# Patient Record
Sex: Male | Born: 1985 | Hispanic: Yes | Marital: Married | Smoking: Current every day smoker
Health system: Southern US, Community
[De-identification: ages and names within clinical notes are randomized; demographics above are authoritative.]

---

## 2014-09-09 ENCOUNTER — Ambulatory Visit (INDEPENDENT_AMBULATORY_CARE_PROVIDER_SITE_OTHER): Payer: Self-pay | Admitting: Physician Assistant

## 2014-09-09 VITALS — BP 122/74 | HR 67 | Temp 98.2°F | Resp 17 | Ht 70.0 in | Wt 184.0 lb

## 2014-09-09 DIAGNOSIS — L729 Follicular cyst of the skin and subcutaneous tissue, unspecified: Secondary | ICD-10-CM

## 2014-09-09 NOTE — Patient Instructions (Signed)
Keep area covered while wound is still open - change dressings at least daily. Do not get area wet for 24 hours - may get wet in the shower after 24 hours and let soap and water run over wound but do not scrub. Use ibuprofen/tylenol for pain. No work for 2 days. Return with any problems/concerns

## 2014-09-09 NOTE — Progress Notes (Signed)
   Subjective:    Patient ID: Nicholas Bowen, male    DOB: Jan 06, 1986, 29 y.o.   MRN: [id number]  HPI  This is a 29 year old male with no significant PMH presenting with "cyst" on his left buttock that keeps getting bigger and bothers him when he is sitting. He has never had anything like this before. He has not had any drainage from the lesion. He denies fever or chills.  Review of Systems  Constitutional: Negative for fever and chills.  Gastrointestinal: Negative for nausea, vomiting and diarrhea.  Skin:       Bump on skin    There are no active problems to display for this patient.  Prior to Admission medications   Not on File   No Known Allergies  Patient's social and family history were reviewed.     Objective:   Physical Exam  Constitutional: He is oriented to person, place, and time. He appears well-developed and well-nourished. No distress.  HENT:  Head: Normocephalic and atraumatic.  Right Ear: Hearing normal.  Left Ear: Hearing normal.  Nose: Nose normal.  Eyes: Conjunctivae and lids are normal. Right eye exhibits no discharge. Left eye exhibits no discharge. No scleral icterus.  Pulmonary/Chest: Effort normal. No respiratory distress.  Musculoskeletal: Normal range of motion.  Neurological: He is alert and oriented to person, place, and time.  Skin: Skin is warm, dry and intact.  1.5x1 cm papule located on lower left buttock that is protruding from skin 1 cm. Lesion is pink without central pore. Lesion is soft.   Psychiatric: He has a normal mood and affect. His speech is normal and behavior is normal. Thought content normal.   BP 122/74 mmHg  Pulse 67  Temp(Src) 98.2 F (36.8 C) (Oral)  Resp 17  Ht 5\' 10"  (1.778 m)  Wt 184 lb (83.462 kg)  BMI 26.40 kg/m2  SpO2 99%  Procedure: verbal consent obtained. Area cleaned with alcohol and anesthetized with 2 cc 1% lido with epi. Area cleaned with 3 betadine swabs. Lesion removed with a dermablade revealing a cyst.  drysol applied to stop bleeding. Wound dressed and wound care discussed.     Assessment & Plan:  1. Cyst of buttocks Cyst was removed completely. Wound dressed and wound care discussed. He will return with any problems/concerns.   Roswell MinersNicole V. Dyke BrackettBush, PA-C, MHS [facility]  09/09/2014

## 2021-03-02 ENCOUNTER — Encounter (HOSPITAL_COMMUNITY): Payer: Self-pay | Admitting: Emergency Medicine

## 2021-03-02 ENCOUNTER — Emergency Department (HOSPITAL_COMMUNITY)
Admission: EM | Admit: 2021-03-02 | Discharge: 2021-03-02 | Disposition: A | Discharge status: Home / Self Care | Payer: Self-pay | Attending: Emergency Medicine | Admitting: Emergency Medicine

## 2021-03-02 DIAGNOSIS — R590 Localized enlarged lymph nodes: Secondary | ICD-10-CM

## 2021-03-02 DIAGNOSIS — R21 Rash and other nonspecific skin eruption: Secondary | ICD-10-CM | POA: Insufficient documentation

## 2021-03-02 DIAGNOSIS — R7989 Other specified abnormal findings of blood chemistry: Secondary | ICD-10-CM

## 2021-03-02 DIAGNOSIS — R599 Enlarged lymph nodes, unspecified: Secondary | ICD-10-CM | POA: Insufficient documentation

## 2021-03-02 DIAGNOSIS — F17210 Nicotine dependence, cigarettes, uncomplicated: Secondary | ICD-10-CM | POA: Insufficient documentation

## 2021-03-02 LAB — CBC WITH DIFFERENTIAL/PLATELET
Abs Immature Granulocytes: 0.05 10*3/uL (ref 0.00–0.07)
Basophils Absolute: 0 10*3/uL (ref 0.0–0.1)
Basophils Relative: 1 %
Eosinophils Absolute: 1.3 10*3/uL — ABNORMAL HIGH (ref 0.0–0.5)
Eosinophils Relative: 17 %
HCT: 47.7 % (ref 39.0–52.0)
Hemoglobin: 16.2 g/dL (ref 13.0–17.0)
Immature Granulocytes: 1 %
Lymphocytes Relative: 10 %
Lymphs Abs: 0.7 10*3/uL (ref 0.7–4.0)
MCH: 31.3 pg (ref 26.0–34.0)
MCHC: 34 g/dL (ref 30.0–36.0)
MCV: 92.3 fL (ref 80.0–100.0)
Monocytes Absolute: 0.6 10*3/uL (ref 0.1–1.0)
Monocytes Relative: 8 %
Neutro Abs: 4.8 10*3/uL (ref 1.7–7.7)
Neutrophils Relative %: 63 %
Platelets: 252 10*3/uL (ref 150–400)
RBC: 5.17 MIL/uL (ref 4.22–5.81)
RDW: 12.9 % (ref 11.5–15.5)
WBC: 7.5 10*3/uL (ref 4.0–10.5)
nRBC: 0 % (ref 0.0–0.2)

## 2021-03-02 LAB — COMPREHENSIVE METABOLIC PANEL
ALT: 458 U/L — ABNORMAL HIGH (ref 0–44)
AST: 185 U/L — ABNORMAL HIGH (ref 15–41)
Albumin: 4.3 g/dL (ref 3.5–5.0)
Alkaline Phosphatase: 191 U/L — ABNORMAL HIGH (ref 38–126)
Anion gap: 10 (ref 5–15)
BUN: 20 mg/dL (ref 6–20)
CO2: 24 mmol/L (ref 22–32)
Calcium: 9.4 mg/dL (ref 8.9–10.3)
Chloride: 104 mmol/L (ref 98–111)
Creatinine, Ser: 0.81 mg/dL (ref 0.61–1.24)
GFR, Estimated: >60 mL/min (ref >60)
Glucose, Bld: 104 mg/dL — ABNORMAL HIGH (ref 70–99)
Potassium: 3.5 mmol/L (ref 3.5–5.1)
Sodium: 138 mmol/L (ref 135–145)
Total Bilirubin: 1 mg/dL (ref 0.3–1.2)
Total Protein: 8 g/dL (ref 6.5–8.1)

## 2021-03-02 MED ORDER — HYDROXYZINE HCL 25 MG PO TABS: 25.0000 mg | ORAL_TABLET | Freq: Four times a day (QID) | ORAL | 0 refills | Status: AC

## 2021-03-02 MED ORDER — CEPHALEXIN 500 MG PO CAPS: 500.0000 mg | ORAL_CAPSULE | Freq: Four times a day (QID) | ORAL | 0 refills | Status: DC

## 2021-03-02 NOTE — ED Provider Notes (Signed)
[facility]-EMERGENCY DEPT Provider Note   CSN: [id number] Arrival date & time: 03/02/21  1328     History Chief Complaint  Patient presents with   Rash   Neck Pain    Nicholas Bowen is a 35 y.o. male.  Patient is a 35 year old male presenting with complaints of rash and swollen lump under his left mandible.  This has been present for the past 5 days.  Patient states that he has felt weak and unwell during this period of time.  He denies any fevers or chills.  He denies sore throat.  He describes an itchy rash to his hands, arms, and legs.  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  Patient speaks minimal [place], mainly [place].  History taken with the use of the translator tablet.  The history is provided by the patient.  Rash Severity:  Moderate Onset quality:  Gradual Duration:  1 week Timing:  Constant Progression:  Worsening Chronicity:  New Relieved by:  Nothing Worsened by:  Nothing Ineffective treatments:  None tried     History reviewed. No pertinent past medical history.  There are no problems to display for this patient.   History reviewed. No pertinent surgical history.     No family history on file.  Social History   Tobacco Use   Smoking status: Every Day    Packs/day: 0.50    Years: 12.00    Pack years: 6.00    Types: Cigarettes    Home Medications Prior to Admission medications   Not on File    Allergies    Patient has no known allergies.  Review of Systems   Review of Systems  Skin:  Positive for rash.  All other systems reviewed and are negative.  Physical Exam Updated Vital Signs BP (!) 152/99   Pulse 70   Temp 98.3 F (36.8 C) (Oral)   Resp 18   SpO2 98%   Physical Exam Vitals and nursing note reviewed.  Constitutional:      General: He is not in acute distress.    Appearance: He is well-developed. He is not diaphoretic.  HENT:     Head: Normocephalic and atraumatic.   Neck:     Comments: There is a 2 cm, tender, swollen node in the left submandibular region.  Posterior oropharynx is clear without exudate or erythema. Cardiovascular:     Rate and Rhythm: Normal rate and regular rhythm.     Heart sounds: No murmur heard.   No friction rub.  Pulmonary:     Effort: Pulmonary effort is normal. No respiratory distress.     Breath sounds: Normal breath sounds. No wheezing or rales.  Abdominal:     General: Bowel sounds are normal. There is no distension.     Palpations: Abdomen is soft.     Tenderness: There is no abdominal tenderness.  Musculoskeletal:        General: Normal range of motion.     Cervical back: Normal range of motion and neck supple. Tenderness present. No rigidity.  Lymphadenopathy:     Cervical: Cervical adenopathy present.  Skin:    General: Skin is warm and dry.     Comments: There is a fine, sandpaper-like rash to the hands, arms, and legs.  There are no petechial lesions.  Neurological:     Mental Status: He is alert and oriented to person, place, and time.     Coordination: Coordination normal.  ED Results / Procedures / Treatments   Labs (all labs ordered are listed, but only abnormal results are displayed) Labs Reviewed  COMPREHENSIVE METABOLIC PANEL - Abnormal; Notable for the following components:      Result Value   Glucose, Bld 104 (*)    AST 185 (*)    ALT 458 (*)    Alkaline Phosphatase 191 (*)    All other components within normal limits  CBC WITH DIFFERENTIAL/PLATELET - Abnormal; Notable for the following components:   Eosinophils Absolute 1.3 (*)    All other components within normal limits  MUMPS VIRAL CULTURE  MUMPS ANTIBODY, IGM  MUMPS ANTIBODY, IGG    EKG None  Radiology No results found.  Procedures Procedures   Medications Ordered in ED Medications - No data to display  ED Course  I have reviewed the triage vital signs and the nursing notes.  Pertinent labs & imaging results that  were available during my care of the patient were reviewed by me and considered in my medical decision making (see chart for details).    MDM Rules/Calculators/A&P  Patient presenting with swollen submandibular lymph node, likely reactive in nature.  It is tender to palpation and will be treated with Keflex.  He will also be given hydroxyzine he can take for the rash.  Laboratory studies show an elevation of his LFTs, the significance of which I am uncertain.  An acute hepatitis panel will be added onto his laboratory studies.  Patient does admit to consuming alcohol, but mainly on the weekends.  He denies excessive Tylenol use, but does tell me he takes a pain medication he originally purchased in [place], but does not know the name.  I have advised him to refrain from alcohol consumption, avoid Tylenol use, and will require follow-up of his LFTs as an outpatient.  Patient has no primary doctor, and was advised to see about establishing primary.  Final Clinical Impression(s) / ED Diagnoses Final diagnoses:  None    Rx / DC Orders ED Discharge Orders     None        Geoffery Lyons, MD 03/02/21 2308

## 2021-03-02 NOTE — ED Triage Notes (Signed)
Triage completed using interpreter Marily Memos [phone].  Patient c/o overwhelming restlessness with itching rash to legs, chest, and hands. Also reports L neck pain with swelling.

## 2021-03-02 NOTE — Discharge Instructions (Addendum)
Begin taking Keflex as prescribed.  Begin taking hydroxyzine as prescribed as needed for itching.  You should have your liver functions retested in 7 to 10 days.  Please follow-up with a primary doctor for this and to have your lymph node rechecked.

## 2021-03-02 NOTE — ED Provider Notes (Signed)
Emergency Medicine Provider Triage Evaluation Note  Nicholas Bowen , a 35 y.o. male  was evaluated in triage.  Pt complains of facial swelling as well as rash.  Patient states that about 3 days ago he began having some left-sided swelling to the left submandibular region.  It became painful over the past 1 to 2 days.  Also over the past 1 to 2 days he began experiencing a pruritic papular rash to the hands, arms, legs, and torso.  Reports associated fatigue as well as a feeling of anxiety today.  No shortness of breath or chest pain.  Physical Exam  BP (!) 145/108   Pulse 83   Temp 98.3 F (36.8 C) (Oral)   Resp 18   SpO2 97%  Gen:   Awake, no distress   Resp:  Normal effort  MSK:   Moves extremities without difficulty  Other:    Medical Decision Making  Medically screening exam initiated at 2:14 PM.  Appropriate orders placed.  Nicholas Bowen was informed that the remainder of the evaluation will be completed by another provider, this initial triage assessment does not replace that evaluation, and the importance of remaining in the ED until their evaluation is complete.   Placido Sou, PA-C 03/02/21 1415    Little, Ambrose Finland, MD 03/02/21 [phone]

## 2021-03-03 LAB — HEPATITIS PANEL, ACUTE
HCV Ab: NONREACTIVE
Hep A IgM: NONREACTIVE
Hep B C IgM: NONREACTIVE
Hepatitis B Surface Ag: NONREACTIVE

## 2021-03-04 LAB — MUMPS ANTIBODY, IGG: Mumps IgG: 87.8 AU/mL (ref >10.9)

## 2021-03-04 LAB — MUMPS ANTIBODY, IGM: Mumps IgM: <0.8 AU (ref 0.00–0.79)

## 2021-03-06 ENCOUNTER — Emergency Department (HOSPITAL_COMMUNITY): Payer: Self-pay

## 2021-03-06 ENCOUNTER — Emergency Department (HOSPITAL_COMMUNITY)
Admission: EM | Admit: 2021-03-06 | Discharge: 2021-03-07 | Disposition: A | Discharge status: Home / Self Care | Payer: Self-pay | Attending: Emergency Medicine | Admitting: Emergency Medicine

## 2021-03-06 ENCOUNTER — Other Ambulatory Visit: Payer: Self-pay

## 2021-03-06 ENCOUNTER — Encounter (HOSPITAL_COMMUNITY): Payer: Self-pay

## 2021-03-06 DIAGNOSIS — R059 Cough, unspecified: Secondary | ICD-10-CM | POA: Insufficient documentation

## 2021-03-06 DIAGNOSIS — R7989 Other specified abnormal findings of blood chemistry: Secondary | ICD-10-CM

## 2021-03-06 DIAGNOSIS — R7401 Elevation of levels of liver transaminase levels: Secondary | ICD-10-CM | POA: Insufficient documentation

## 2021-03-06 DIAGNOSIS — R0602 Shortness of breath: Secondary | ICD-10-CM | POA: Insufficient documentation

## 2021-03-06 DIAGNOSIS — Z20822 Contact with and (suspected) exposure to covid-19: Secondary | ICD-10-CM | POA: Insufficient documentation

## 2021-03-06 DIAGNOSIS — F17210 Nicotine dependence, cigarettes, uncomplicated: Secondary | ICD-10-CM | POA: Insufficient documentation

## 2021-03-06 DIAGNOSIS — I889 Nonspecific lymphadenitis, unspecified: Secondary | ICD-10-CM | POA: Insufficient documentation

## 2021-03-06 DIAGNOSIS — R079 Chest pain, unspecified: Secondary | ICD-10-CM | POA: Insufficient documentation

## 2021-03-06 LAB — CBC
HCT: 48.9 % (ref 39.0–52.0)
Hemoglobin: 16.3 g/dL (ref 13.0–17.0)
MCH: 31.2 pg (ref 26.0–34.0)
MCHC: 33.3 g/dL (ref 30.0–36.0)
MCV: 93.5 fL (ref 80.0–100.0)
Platelets: 286 10*3/uL (ref 150–400)
RBC: 5.23 MIL/uL (ref 4.22–5.81)
RDW: 13.1 % (ref 11.5–15.5)
WBC: 12.4 10*3/uL — ABNORMAL HIGH (ref 4.0–10.5)
nRBC: 0 % (ref 0.0–0.2)

## 2021-03-06 LAB — HEPATIC FUNCTION PANEL
ALT: 234 U/L — ABNORMAL HIGH (ref 0–44)
AST: 96 U/L — ABNORMAL HIGH (ref 15–41)
Albumin: 4.1 g/dL (ref 3.5–5.0)
Alkaline Phosphatase: 276 U/L — ABNORMAL HIGH (ref 38–126)
Bilirubin, Direct: 0.3 mg/dL — ABNORMAL HIGH (ref 0.0–0.2)
Indirect Bilirubin: 0.7 mg/dL (ref 0.3–0.9)
Total Bilirubin: 1 mg/dL (ref 0.3–1.2)
Total Protein: 8.2 g/dL — ABNORMAL HIGH (ref 6.5–8.1)

## 2021-03-06 LAB — BASIC METABOLIC PANEL
Anion gap: 10 (ref 5–15)
BUN: 15 mg/dL (ref 6–20)
CO2: 24 mmol/L (ref 22–32)
Calcium: 9.6 mg/dL (ref 8.9–10.3)
Chloride: 101 mmol/L (ref 98–111)
Creatinine, Ser: 0.86 mg/dL (ref 0.61–1.24)
GFR, Estimated: >60 mL/min (ref >60)
Glucose, Bld: 118 mg/dL — ABNORMAL HIGH (ref 70–99)
Potassium: 3.8 mmol/L (ref 3.5–5.1)
Sodium: 135 mmol/L (ref 135–145)

## 2021-03-06 LAB — TROPONIN I (HIGH SENSITIVITY)
Troponin I (High Sensitivity): 2 ng/L (ref <18)
Troponin I (High Sensitivity): 2 ng/L (ref <18)

## 2021-03-06 LAB — RESP PANEL BY RT-PCR (FLU A&B, COVID) ARPGX2
Influenza A by PCR: NEGATIVE
Influenza B by PCR: NEGATIVE
SARS Coronavirus 2 by RT PCR: NEGATIVE

## 2021-03-06 MED ORDER — IOHEXOL 350 MG/ML SOLN
75.0000 mL | Freq: Once | INTRAVENOUS | Status: AC | PRN
  Administered 2021-03-06: 75 mL via INTRAVENOUS

## 2021-03-06 MED ORDER — KETOROLAC TROMETHAMINE 15 MG/ML IJ SOLN
15.0000 mg | Freq: Once | INTRAMUSCULAR | Status: AC
  Administered 2021-03-06: 15 mg via INTRAVENOUS
  Filled 2021-03-06: qty 1

## 2021-03-06 MED ORDER — AMOXICILLIN-POT CLAVULANATE 875-125 MG PO TABS: 1.0000 | ORAL_TABLET | Freq: Two times a day (BID) | ORAL | 0 refills | Status: DC

## 2021-03-06 NOTE — ED Provider Notes (Signed)
Emergency Medicine Provider Triage Evaluation Note  Nicholas Bowen , a 35 y.o. male  was evaluated in triage.  Pt complains of chest pain that began this AM. Pain is to the left chest to back, difficult to describe. Constant. No recent injury or change in activity.   Review of Systems  Positive: Chest pain Negative: N/V, syncope, cough, hemoptysius  Physical Exam  BP (!) 127/93 (BP Location: Right Arm)   Pulse 95   Temp 98.7 F (37.1 C) (Oral)   Resp 13   SpO2 98%  Gen:   Awake Resp:  Normal effort  MSK:   Moves extremities without difficulty  Other:  Left anterior & posterior chest wall tenderness.   Medical Decision Making  Medically screening exam initiated at 10:48 AM.  Appropriate orders placed.  Nicholas Bowen was informed that the remainder of the evaluation will be completed by another provider, this initial triage assessment does not replace that evaluation, and the importance of remaining in the ED until their evaluation is complete.  Chest pain   Desmond Lope 03/06/21 1050    Wynetta Fines, MD 03/06/21 1406

## 2021-03-06 NOTE — ED Triage Notes (Addendum)
Pt c/o L chest pain radiating into L shoulder x starting this morning.  Pain score 9/10.  Pt reports he was seen x 4 days ago for a L side dental abscess and rash.  Pt reports those symptoms have not gotten better.  Sts he was sent for other "liver studies" but he has not gotten the results.     Triage was completed using interpreter Byrd Hesselbach).

## 2021-03-06 NOTE — ED Provider Notes (Signed)
[facility]-EMERGENCY DEPT Provider Note   CSN: [id number] Arrival date & time: 03/06/21  1008     History Chief Complaint  Patient presents with   Chest Pain    Nicholas Bowen is a 35 y.o. male.   Chest Pain Pain location:  L chest Pain quality: aching and dull   Pain radiates to:  L shoulder Pain severity:  Moderate Timing:  Intermittent Chronicity:  New Context: breathing and at rest   Relieved by:  None tried Worsened by:  Coughing and deep breathing Associated symptoms: cough and shortness of breath   Associated symptoms: no abdominal pain, no altered mental status, no diaphoresis, no dysphagia, no fever, no headache, no heartburn, no lower extremity edema, no nausea, no near-syncope, no palpitations and no vomiting   Risk factors: male sex and smoking    HPI: A 35 year old patient presents for evaluation of chest pain. Initial onset of pain was more than 6 hours ago. The patient's chest pain is sharp and is not worse with exertion. The patient's chest pain is middle- or left-sided, is not well-localized, is not described as heaviness/pressure/tightness and does radiate to the arms/jaw/neck. The patient does not complain of nausea and denies diaphoresis. The patient has smoked in the past 90 days. The patient has no history of stroke, has no history of peripheral artery disease, denies any history of treated diabetes, has no relevant family history of coronary artery disease (first degree relative at less than age 37), is not hypertensive, has no history of hypercholesterolemia and does not have an elevated BMI (>=30).   History reviewed. No pertinent past medical history.  There are no problems to display for this patient.   History reviewed. No pertinent surgical history.     No family history on file.  Social History   Tobacco Use   Smoking status: Every Day    Packs/day: 0.50    Years: 12.00    Pack years: 6.00    Types:  Cigarettes  Substance Use Topics   Alcohol use: Yes   Drug use: Not Currently    Home Medications Prior to Admission medications   Medication Sig Start Date End Date Taking? Authorizing Provider  amoxicillin-clavulanate (AUGMENTIN) 875-125 MG tablet Take 1 tablet by mouth every 12 (twelve) hours. 03/07/21   Ernie AvenaLawsing, Skyleigh Windle, MD  hydrOXYzine (ATARAX/VISTARIL) 25 MG tablet Take 1 tablet (25 mg total) by mouth every 6 (six) hours. 03/02/21   Geoffery Lyonselo, Douglas, MD    Allergies    Patient has no known allergies.  Review of Systems   Review of Systems  Constitutional:  Negative for diaphoresis and fever.  HENT:  Positive for facial swelling. Negative for trouble swallowing.   Respiratory:  Positive for cough and shortness of breath.   Cardiovascular:  Positive for chest pain. Negative for palpitations and near-syncope.  Gastrointestinal:  Negative for abdominal pain, heartburn, nausea and vomiting.  Neurological:  Negative for headaches.  All other systems reviewed and are negative.  Physical Exam Updated Vital Signs BP 110/71   Pulse 79   Temp 98.7 F (37.1 C) (Oral)   Resp 19   SpO2 95%   Physical Exam Vitals and nursing note reviewed.  Constitutional:      Appearance: He is well-developed.  HENT:     Head: Normocephalic and atraumatic.     Jaw: No trismus.     Comments: Left-sided tender submandibular lymph node present with no surrounding erythema.  No oropharyngeal swelling. Eyes:  Conjunctiva/sclera: Conjunctivae normal.  Cardiovascular:     Rate and Rhythm: Normal rate and regular rhythm.     Heart sounds: No murmur heard. Pulmonary:     Effort: Pulmonary effort is normal. No respiratory distress.     Breath sounds: Normal breath sounds.  Abdominal:     Palpations: Abdomen is soft.     Tenderness: There is no abdominal tenderness.  Musculoskeletal:     Cervical back: Neck supple.  Lymphadenopathy:     Cervical: Cervical adenopathy present.  Skin:    General:  Skin is warm and dry.  Neurological:     Mental Status: He is alert.    ED Results / Procedures / Treatments   Labs (all labs ordered are listed, but only abnormal results are displayed) Labs Reviewed  BASIC METABOLIC PANEL - Abnormal; Notable for the following components:      Result Value   Glucose, Bld 118 (*)    All other components within normal limits  CBC - Abnormal; Notable for the following components:   WBC 12.4 (*)    All other components within normal limits  HEPATIC FUNCTION PANEL - Abnormal; Notable for the following components:   Total Protein 8.2 (*)    AST 96 (*)    ALT 234 (*)    Alkaline Phosphatase 276 (*)    Bilirubin, Direct 0.3 (*)    All other components within normal limits  RESP PANEL BY RT-PCR (FLU A&B, COVID) ARPGX2  TROPONIN I (HIGH SENSITIVITY)  TROPONIN I (HIGH SENSITIVITY)    EKG EKG Interpretation  Date/Time:  Sunday March 06 2021 19:49:08 EDT Ventricular Rate:  90 PR Interval:  153 QRS Duration: 102 QT Interval:  367 QTC Calculation: 449 R Axis:   58 Text Interpretation: Sinus rhythm No STEMI Confirmed by Ernie Avena ([id number]) on 03/06/2021 11:56:43 PM  Radiology DG Chest 2 View  Result Date: 03/06/2021 CLINICAL DATA:  chest pain EXAM: CHEST - 2 VIEW COMPARISON:  None. FINDINGS: The cardiomediastinal silhouette is normal in contour. No pleural effusion. No pneumothorax. Scattered linear opacities consistent with atelectasis. Visualized abdomen is unremarkable. No acute osseous abnormality noted. IMPRESSION: LEFT-sided atelectasis. Electronically Signed   By: Meda Klinefelter MD   On: 03/06/2021 11:10   CT Soft Tissue Neck W Contrast  Result Date: 03/06/2021 CLINICAL DATA:  Cervical lymphadenopathy EXAM: CT NECK WITH CONTRAST TECHNIQUE: Multidetector CT imaging of the neck was performed using the standard protocol following the bolus administration of intravenous contrast. CONTRAST:  36mL OMNIPAQUE IOHEXOL 350 MG/ML SOLN COMPARISON:   None. FINDINGS: PHARYNX AND LARYNX: The nasopharynx, oropharynx and larynx are normal. There are periapical lucencies of the left maxillary molars. Otherwise the visible portions of the oral cavity, tongue base and floor of mouth are normal. Normal epiglottis, vallecula and pyriform sinuses. The larynx is normal. No retropharyngeal abscess, effusion or lymphadenopathy. SALIVARY GLANDS: Normal parotid, submandibular and sublingual glands. THYROID: Normal. LYMPH NODES: Left level 1B nodes measure up to 17 mm. No other enlarged or abnormal density lymph nodes. There are multiple subcentimeter lymph nodes along the left cervical chain. VASCULAR: Major cervical vessels are patent. LIMITED INTRACRANIAL: Normal. VISUALIZED ORBITS: Normal. MASTOIDS AND VISUALIZED PARANASAL SINUSES: No fluid levels or advanced mucosal thickening. No mastoid effusion. SKELETON: No bony spinal canal stenosis. No lytic or blastic lesions. UPPER CHEST: Clear. OTHER: None. IMPRESSION: 1. Reactive left level 1B lymphadenopathy with multiple subcentimeter lymph nodes along the left cervical chain. 2. Periapical lucencies of the left maxillary molars. Electronically Signed  By: Deatra Robinson M.D.   On: 03/06/2021 21:32   CT Chest W Contrast  Result Date: 03/07/2021 CLINICAL DATA:  Left-sided chest pain radiating to the left shoulder. EXAM: CT CHEST WITH CONTRAST TECHNIQUE: Multidetector CT imaging of the chest was performed during intravenous contrast administration. CONTRAST:  94mL OMNIPAQUE IOHEXOL 350 MG/ML SOLN COMPARISON:  None. FINDINGS: Cardiovascular: No significant vascular findings. Normal heart size. No pericardial effusion. Mediastinum/Nodes: No enlarged mediastinal, hilar, or axillary lymph nodes. Thyroid gland, trachea, and esophagus demonstrate no significant findings. Lungs/Pleura: Mild linear scarring and/or atelectasis is seen within the left upper lobe and bilateral lower lobes. There is no evidence of acute infiltrate,  pleural effusion or pneumothorax. Upper Abdomen: There is diffuse fatty infiltration of the liver parenchyma. Musculoskeletal: No chest wall abnormality. No acute or significant osseous findings. IMPRESSION: 1. No evidence of pulmonary embolism. 2. Mild left upper lobe and bilateral lower lobe linear scarring and/or atelectasis. 3. Fatty liver. Electronically Signed   By: Aram Candela M.D.   On: 03/07/2021 01:34   US Abdomen Limited RUQ (LIVER/GB)  Result Date: 03/06/2021 CLINICAL DATA:  Elevated LFTs EXAM: ULTRASOUND ABDOMEN LIMITED RIGHT UPPER QUADRANT COMPARISON:  None FINDINGS: Gallbladder: No gallstones or wall thickening visualized. No sonographic Murphy sign noted by sonographer. Common bile duct: Diameter: 3.7, nondilated Liver: Heterogeneously hypoechoic lesion seen in the anterior right lobe liver measuring 2.4 x 1.7 x 2.1 cm. Small amount of internal color Doppler flow. No other focal liver lesion. Background of diffusely increased hepatic echogenicity with slightly diminished through transmission. Smooth liver surface contour. Portal vein is patent on color Doppler imaging with normal direction of blood flow towards the liver. Other: None. IMPRESSION: Heterogeneously hypoechoic lesion seen in the anterior right lobe liver measuring up to 2.4 cm in size with small amount of internal color Doppler flow. Overall appearance is nonspecific on this sonographic examination. Recommend further characterization with dedicated hepatic MRI which can be performed on a nonemergent outpatient basis. Diffusely increased hepatic echogenicity suggesting underlying hepatic steatosis. Electronically Signed   By: Kreg Shropshire M.D.   On: 03/06/2021 21:51    Procedures Procedures   Medications Ordered in ED Medications  iohexol (OMNIPAQUE) 350 MG/ML injection 75 mL (75 mLs Intravenous Contrast Given 03/06/21 2104)  ketorolac (TORADOL) 15 MG/ML injection 15 mg (15 mg Intravenous Given 03/06/21 2351)  iohexol  (OMNIPAQUE) 350 MG/ML injection 75 mL (75 mLs Intravenous Contrast Given 03/07/21 0030)    ED Course  I have reviewed the triage vital signs and the nursing notes.  Pertinent labs & imaging results that were available during my care of the patient were reviewed by me and considered in my medical decision making (see chart for details).    MDM Rules/Calculators/A&P HEAR Score: 1                         35 year old male presenting to the emergency department with left-sided chest discomfort described as sharp and pleuritic, radiating to his left shoulder and back.  He also has had several days up to a week of cervical lymphadenopathy with a painful tender lymph node for which she had been seen in the emergency department previously, diagnosed with reactive lymphadenitis and prescribed Keflex.  He states that he has had persistent painful swelling of a lymph node under his mandible despite his p.o. Keflex.  His chest pain is atypical, sharp and pleuritic.  He does have a 20-year smoking history.  We will evaluate  further with CT of the neck, CT of the chest.  EKG was without evidence of ischemic changes.  Chest x-ray was unremarkable for focal airspace disease.  His hear score is 1.  Delta troponin is negative, EKG nonischemic, low concern for ACS at this time.  CT of the chest was without focal consolidation, pneumothorax, pulmonary embolism,, other acute cardiac or pulmonary abnormality.  CT of the neck revealed cervical lymphadenopathy on the left.  Periapical lucencies of the left maxillary molars were also noted.  A referral to dentistry was placed.  The patient had persistent elevation of LFTs this visit and during his previous ED visit.  He previously had a hepatitis panel which was negative.  He had an ultrasound of the right upper quadrant which ultimately revealed hepatic steatosis which is likely the etiology of his elevation in LFTs.  No evidence of cholecystitis.  No evidence of biliary  ductal dilation.  No evidence of cirrhosis.  The patient was informed of his work-up.  He was administered Toradol for the pain and felt much better following this intervention.  He currently does not have a primary care provider and was advised to follow-up outpatient.  He did have a finding there was incidental of a lesion on his liver on ultrasound with a heterogenously hyperechoic lesion in the anterior right lobe measuring 2.4 cm in size with a small amount of internal color Doppler flow.  It was recommended that an outpatient hepatic MRI be performed on a nonemergent basis.  The patient was strongly advised to follow-up to establish care with a PCP to discuss scheduling of this MRI.  Overall stable for continued outpatient management.  Given the patient's persistent reactive lymphadenitis, his medication was switched from Keflex to Augmentin.  Final Clinical Impression(s) / ED Diagnoses Final diagnoses:  Elevated LFTs  Nonspecific chest pain  Lymphadenitis    Rx / DC Orders ED Discharge Orders          Ordered    amoxicillin-clavulanate (AUGMENTIN) 875-125 MG tablet  Every 12 hours        03/07/21 0148    amoxicillin-clavulanate (AUGMENTIN) 875-125 MG tablet  Every 12 hours,   Status:  Discontinued        03/06/21 2239    Ambulatory referral to Dentistry        03/06/21 2239             Ernie Avena, MD 03/07/21 [phone]

## 2021-03-07 ENCOUNTER — Emergency Department (HOSPITAL_COMMUNITY): Payer: Self-pay

## 2021-03-07 MED ORDER — AMOXICILLIN-POT CLAVULANATE 875-125 MG PO TABS: 1.0000 | ORAL_TABLET | Freq: Two times a day (BID) | ORAL | 0 refills | Status: AC

## 2021-03-07 MED ORDER — IOHEXOL 350 MG/ML SOLN
75.0000 mL | Freq: Once | INTRAVENOUS | Status: AC | PRN
  Administered 2021-03-07: 75 mL via INTRAVENOUS

## 2021-03-07 NOTE — ED Notes (Signed)
Patient transported to CT 

## 2023-03-13 IMAGING — CT CT CHEST W/ CM
2 of 3 series · 15 of 36 positions shown, 18 images · IV contrast (OMNIPAQUE 350)
Comparison: None.

CLINICAL DATA: Left-sided chest pain radiating to the left
shoulder.

EXAM:
CT CHEST WITH CONTRAST
TECHNIQUE: Multidetector CT imaging of the chest was performed during
intravenous contrast administration.
CONTRAST:  75mL OMNIPAQUE IOHEXOL 350 MG/ML SOLN

[Series 2: axial st · axial · 0.83mm/px · z∈[+1250,+1490]mm · 12 of 142 slices shown, 15 images]
[im 11/142  mediastinal]
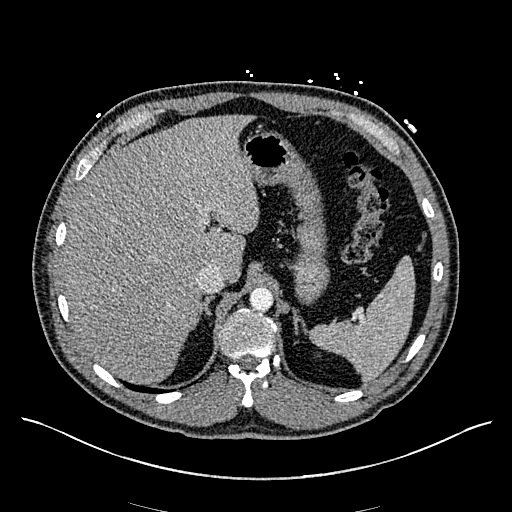
[im 11/142  lung]
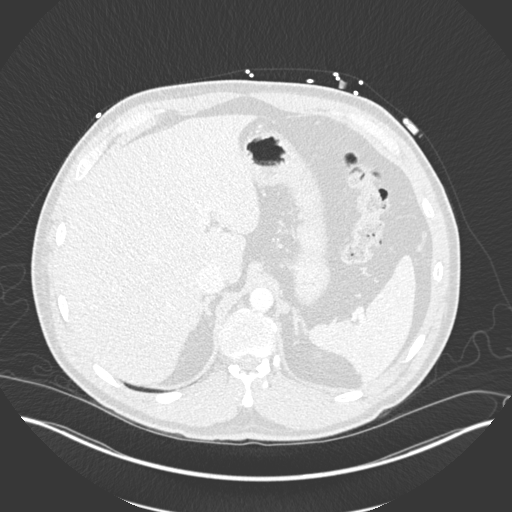
[im 21/142  lung]
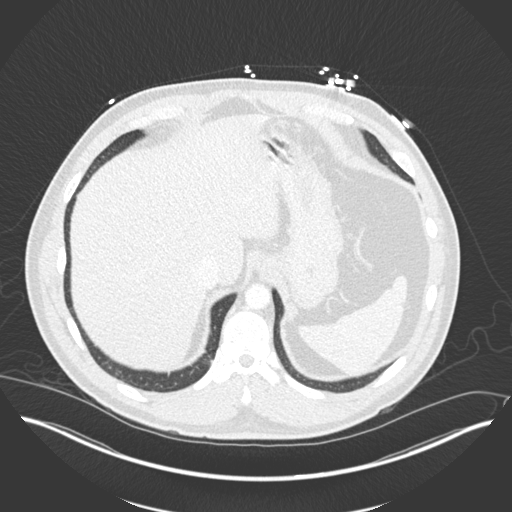
[im 32/142  lung]
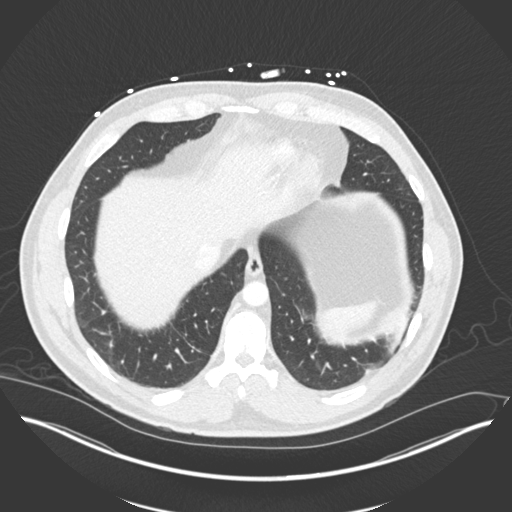
[im 42/142  lung]
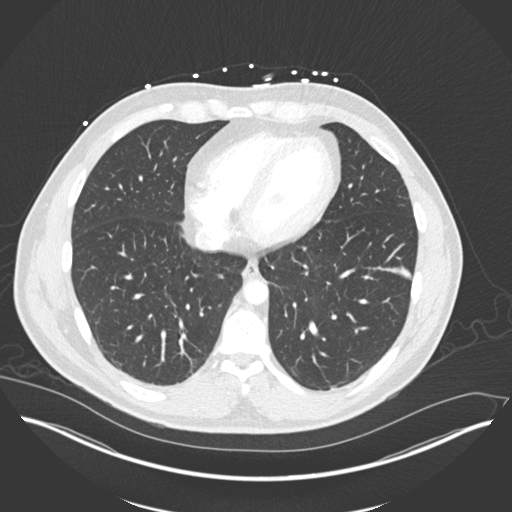
[im 53/142  mediastinal]
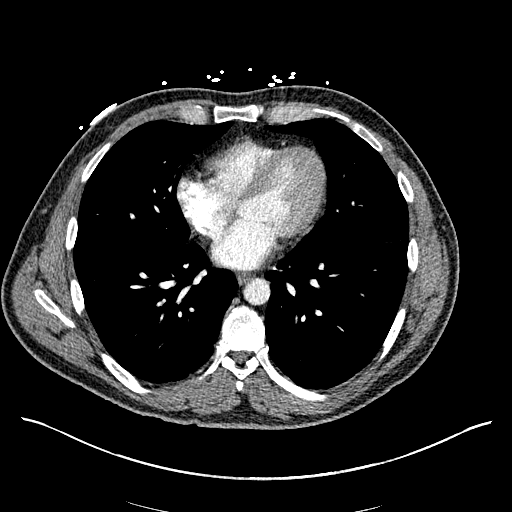
[im 53/142  lung]
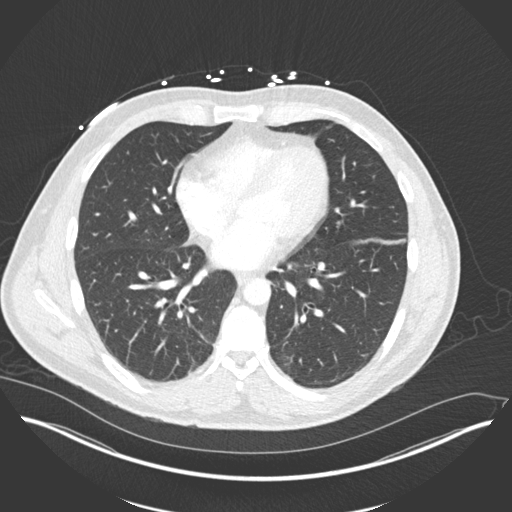
[im 63/142  lung]
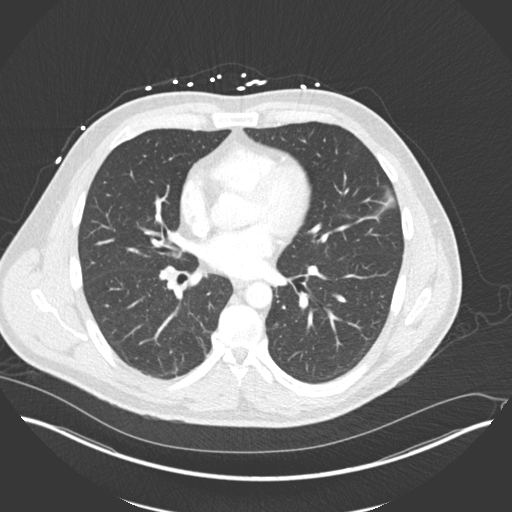
[im 79/142  lung]
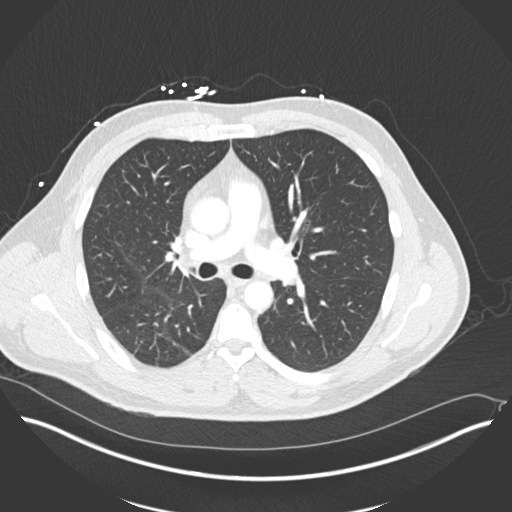
[im 89/142  lung]
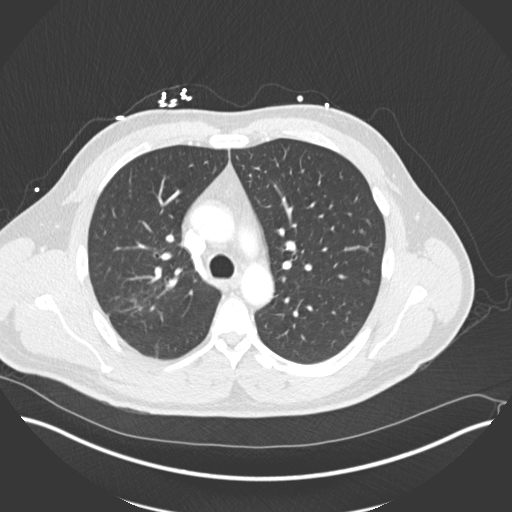
[im 100/142  mediastinal]
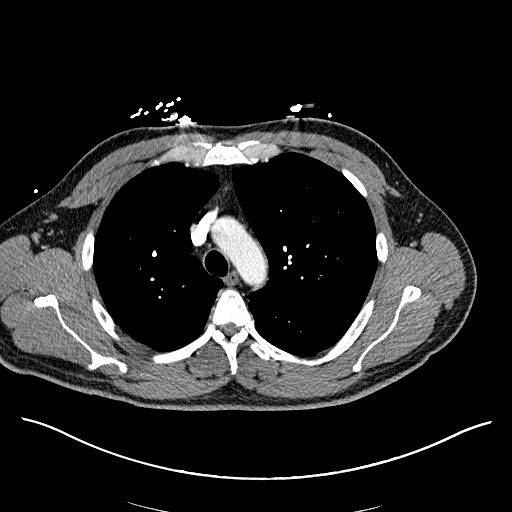
[im 100/142  lung]
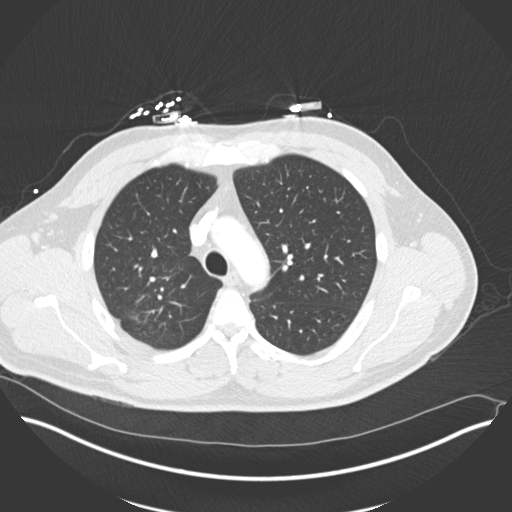
[im 110/142  lung]
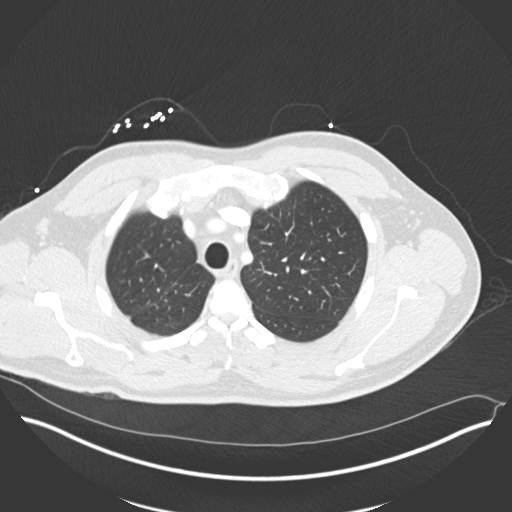
[im 121/142  lung]
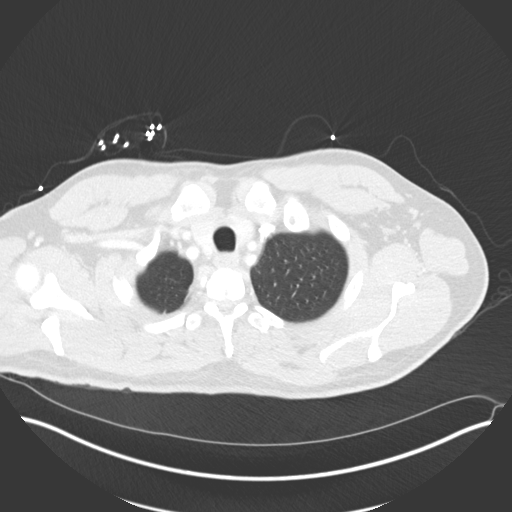
[im 131/142  lung]
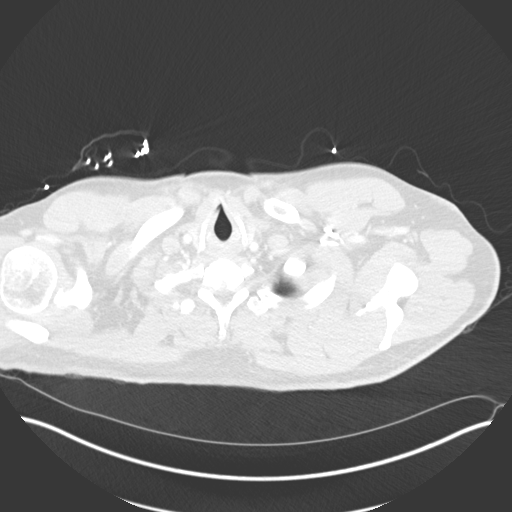

[Series 6: coronal · coronal · 0.57mm/px · 3 of 134 slices shown]
[im 27/134  lung]
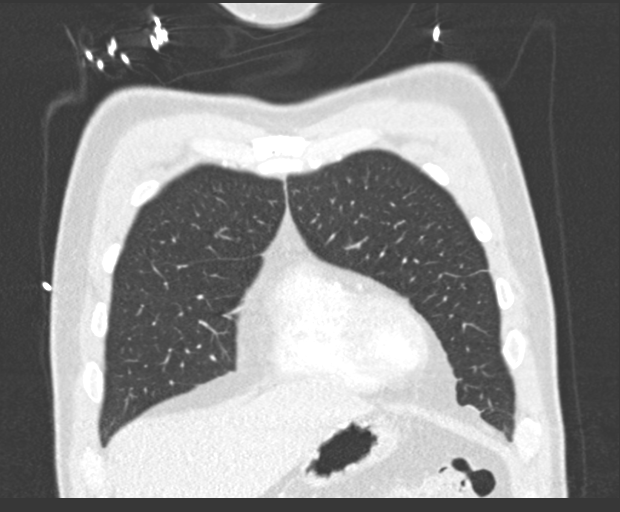
[im 54/134  lung]
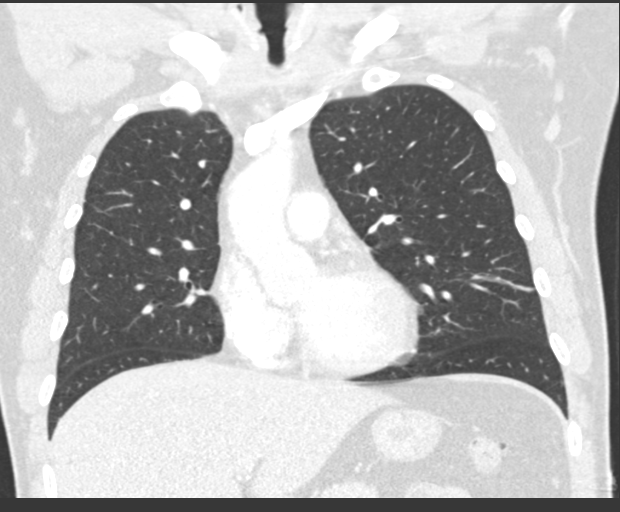
[im 80/134  lung]
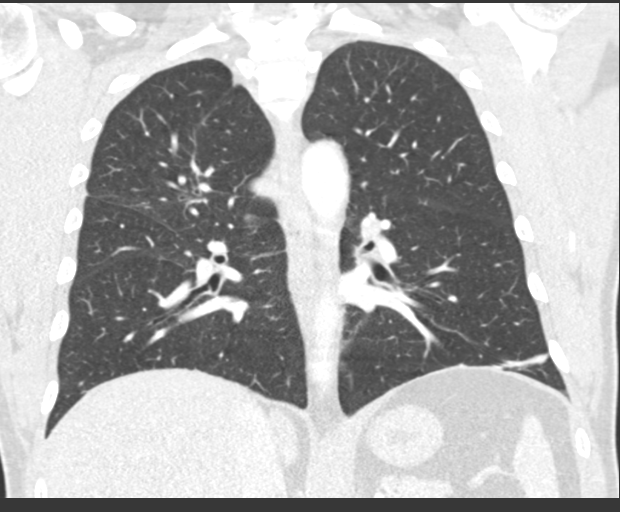

[15 of 36 positions shown; findings below may reference images not displayed]

FINDINGS: Cardiovascular: No significant vascular findings. Normal heart size.
No pericardial effusion.

Mediastinum/Nodes: No enlarged mediastinal, hilar, or axillary lymph
nodes. Thyroid gland, trachea, and esophagus demonstrate no
significant findings.

Lungs/Pleura: Mild linear scarring and/or atelectasis is seen within
the left upper lobe and bilateral lower lobes.

There is no evidence of acute infiltrate, pleural effusion or
pneumothorax.

Upper Abdomen: There is diffuse fatty infiltration of the liver
parenchyma.

Musculoskeletal: No chest wall abnormality. No acute or significant
osseous findings.
IMPRESSION: 1. No evidence of pulmonary embolism.
2. Mild left upper lobe and bilateral lower lobe linear scarring
and/or atelectasis.
3. Fatty liver.
